# Patient Record
Sex: Male | Born: 1970 | Race: Black or African American | Hispanic: No | Smoking: Never smoker
Health system: Southern US, Community
[De-identification: ages and names within clinical notes are randomized; demographics above are authoritative.]

## PROBLEM LIST (undated history)

## (undated) DIAGNOSIS — I10 Essential (primary) hypertension: Secondary | ICD-10-CM

## (undated) HISTORY — PX: APPENDECTOMY: SHX54

---

## 1998-09-29 ENCOUNTER — Encounter: Payer: Self-pay | Admitting: Surgery

## 1998-09-30 ENCOUNTER — Inpatient Hospital Stay (HOSPITAL_COMMUNITY): Admission: EM | Admit: 1998-09-30 | Discharge: 1998-10-03 | Payer: Self-pay

## 2012-11-04 ENCOUNTER — Telehealth: Payer: Self-pay | Admitting: Family Medicine

## 2013-08-30 ENCOUNTER — Encounter: Payer: Self-pay | Admitting: *Deleted

## 2013-08-30 ENCOUNTER — Ambulatory Visit (INDEPENDENT_AMBULATORY_CARE_PROVIDER_SITE_OTHER): Payer: Managed Care, Other (non HMO) | Admitting: Family Medicine

## 2013-08-30 ENCOUNTER — Encounter: Payer: Self-pay | Admitting: Family Medicine

## 2013-08-30 VITALS — BP 164/83 | HR 91 | Temp 99.8°F | Ht 71.0 in | Wt 204.0 lb

## 2013-08-30 DIAGNOSIS — L0291 Cutaneous abscess, unspecified: Secondary | ICD-10-CM

## 2013-08-30 DIAGNOSIS — L989 Disorder of the skin and subcutaneous tissue, unspecified: Secondary | ICD-10-CM

## 2013-08-30 DIAGNOSIS — L0390 Cellulitis, unspecified: Principal | ICD-10-CM

## 2013-08-30 LAB — POCT CBC
Granulocyte percent: 72.8 %G (ref 37–80)
HCT, POC: 42.2 % — AB (ref 43.5–53.7)
Hemoglobin: 13.9 g/dL — AB (ref 14.1–18.1)
Lymph, poc: 2.3 (ref 0.6–3.4)
MCH, POC: 29.8 pg (ref 27–31.2)
MCHC: 32.9 g/dL (ref 31.8–35.4)
MCV: 90.6 fL (ref 80–97)
MPV: 7.5 fL (ref 0–99.8)
POC Granulocyte: 6.9 (ref 2–6.9)
POC LYMPH PERCENT: 24.3 %L (ref 10–50)
Platelet Count, POC: 264 10*3/uL (ref 142–424)
RBC: 4.7 M/uL (ref 4.69–6.13)
RDW, POC: 12.9 %
WBC: 9.5 10*3/uL (ref 4.6–10.2)

## 2013-08-30 MED ORDER — SULFAMETHOXAZOLE-TMP DS 800-160 MG PO TABS: 1.0000 | ORAL_TABLET | Freq: Two times a day (BID) | ORAL | Status: DC

## 2013-08-30 MED ORDER — CEFTRIAXONE SODIUM 1 G IJ SOLR
1.0000 g | Freq: Once | INTRAMUSCULAR | Status: AC
  Administered 2013-08-30: 1 g via INTRAMUSCULAR

## 2013-08-30 NOTE — Progress Notes (Signed)
   Subjective:    Patient ID: Thomas MoteChristopher Joffe, male    DOB: 05/29/1970, 43 y.o.   MRN: [id number]  HPI Patient here tonight for boil in bend of left shoulder area. This was first noticed about 5 days ago.         There are no active problems to display for this patient.  No outpatient encounter prescriptions on file as of 08/30/2013.    Review of Systems  Constitutional: Negative.   HENT: Negative.   Eyes: Negative.   Respiratory: Negative.   Cardiovascular: Negative.   Gastrointestinal: Negative.   Endocrine: Negative.   Genitourinary: Negative.   Musculoskeletal: Negative.   Skin: Negative.        Boil on left shoulder  Allergic/Immunologic: Negative.   Neurological: Negative.   Hematological: Negative.   Psychiatric/Behavioral: Negative.        Objective:   Physical Exam BP 164/83  Pulse 91  Temp(Src) 99.8 F (37.7 C) (Oral)  Ht 5\' 11"  (1.803 m)  Wt 204 lb (92.534 kg)  BMI 28.46 kg/m2  There is a furuncle near the left a.c. joint that appears to have multiple openings on it. With light pressure and a Q-tip drainage was expressed from the furuncle and sent for culture and sensitivity. A small wick was inserted in the opening to prevent it from closing. The patient was told he could change the dressing if it became soaked with blood and put another dressing on this evening.  Results for orders placed in visit on 08/30/13  POCT CBC      Result Value Ref Range   WBC 9.5  4.6 - 10.2 K/uL   Lymph, poc 2.3  0.6 - 3.4   POC LYMPH PERCENT 24.3  10 - 50 %L   MID (cbc)    0 - 0.9   POC MID %    0 - 12 %M   POC Granulocyte 6.9  2 - 6.9   Granulocyte percent 72.8  37 - 80 %G   RBC 4.7  4.69 - 6.13 M/uL   Hemoglobin 13.9 (*) 14.1 - 18.1 g/dL   HCT, POC 40.942.2 (*) 81.143.5 - 53.7 %   MCV 90.6  80 - 97 fL   MCH, POC 29.8  27 - 31.2 pg   MCHC 32.9  31.8 - 35.4 g/dL   RDW, POC 91.412.9     Platelet Count, POC 264.0  142 - 424 K/uL   MPV 7.5  0 - 99.8 fL   The patient was  made aware of the CBC results before he left the office    Assessment & Plan:   1. Skin lesion - Aerobic culture - cefTRIAXone (ROCEPHIN) injection 1 g; Inject 1 g into the muscle once. - POCT CBC  2. Abscess and cellulitis of skin of left shoulder near a.c. joint Patient Instructions  Change dressing as needed Take antibiotic as directed Take extra strength Tylenol 2 every 6 hours if needed for severe pain Return to clinic tomorrow for x-ray of shoulder  Remain out of work--- note was given  Nyra Capeson W. Sandria Mcenroe MD

## 2013-08-30 NOTE — Patient Instructions (Signed)
Change dressing as needed Take antibiotic as directed Take extra strength Tylenol 2 every 6 hours if needed for severe pain Return to clinic tomorrow for x-ray of shoulder

## 2013-08-31 ENCOUNTER — Encounter: Payer: Self-pay | Admitting: *Deleted

## 2013-08-31 ENCOUNTER — Ambulatory Visit (INDEPENDENT_AMBULATORY_CARE_PROVIDER_SITE_OTHER): Payer: Managed Care, Other (non HMO)

## 2013-08-31 ENCOUNTER — Other Ambulatory Visit (INDEPENDENT_AMBULATORY_CARE_PROVIDER_SITE_OTHER): Payer: Managed Care, Other (non HMO) | Admitting: *Deleted

## 2013-08-31 ENCOUNTER — Other Ambulatory Visit: Payer: Self-pay | Admitting: *Deleted

## 2013-08-31 ENCOUNTER — Other Ambulatory Visit: Payer: Managed Care, Other (non HMO)

## 2013-08-31 DIAGNOSIS — IMO0002 Reserved for concepts with insufficient information to code with codable children: Secondary | ICD-10-CM

## 2013-08-31 DIAGNOSIS — L02414 Cutaneous abscess of left upper limb: Secondary | ICD-10-CM

## 2013-08-31 MED ORDER — DEXTROSE 5 % IV SOLN: 1.0000 g | Freq: Once | INTRAVENOUS | Status: DC

## 2013-08-31 MED ORDER — CEFTRIAXONE SODIUM 1 G IJ SOLR
1.0000 g | Freq: Once | INTRAMUSCULAR | Status: AC
  Administered 2013-08-31: 1 g via INTRAMUSCULAR

## 2013-09-02 ENCOUNTER — Encounter: Payer: Self-pay | Admitting: *Deleted

## 2013-09-02 LAB — AEROBIC CULTURE

## 2013-09-02 NOTE — Progress Notes (Signed)
Patient ID: Thomas MoteChristopher Lauture, male   DOB: 04/17/1970, 43 y.o.   MRN: [id number] Patient came in today for DWM to recheck the left shoulder abscess that we have been following. The area is better, only slight redness and the area was slightly open. There is a scant amount of drainage on the bandage that pt is wearing. DWM expressed the abscess with only minimal return. The area was cleaned with peroxide and covered with a clean peroxide soaked gauze and bandaid. Pt is to see [facility] next Tues and then return to work next [employer].

## 2013-09-07 ENCOUNTER — Ambulatory Visit: Payer: Managed Care, Other (non HMO) | Admitting: Physician Assistant

## 2013-09-07 ENCOUNTER — Telehealth: Payer: Self-pay | Admitting: Family Medicine

## 2013-09-07 NOTE — Telephone Encounter (Signed)
Patient aware.

## 2013-09-07 NOTE — Telephone Encounter (Signed)
Message copied by Azalee CourseFULP, ASHLEY on Tue Sep 07, 2013  4:41 PM ------      Message from: Inis SizerWEBSTER, WILLIAM L      Created: Tue Sep 07, 2013  1:27 PM       Pls inform ATB he is taking covers the bacteria ------

## 2013-09-10 ENCOUNTER — Encounter: Payer: Self-pay | Admitting: Physician Assistant

## 2013-09-10 ENCOUNTER — Ambulatory Visit (INDEPENDENT_AMBULATORY_CARE_PROVIDER_SITE_OTHER): Payer: Managed Care, Other (non HMO) | Admitting: Physician Assistant

## 2013-09-10 VITALS — BP 132/66 | HR 88 | Temp 98.7°F | Ht 71.0 in | Wt 204.0 lb

## 2013-09-10 DIAGNOSIS — L02219 Cutaneous abscess of trunk, unspecified: Secondary | ICD-10-CM

## 2013-09-10 DIAGNOSIS — L03319 Cellulitis of trunk, unspecified: Principal | ICD-10-CM

## 2013-09-10 NOTE — Progress Notes (Signed)
Subjective:     Patient ID: Rica MoteChristopher Altemus, male   DOB: 12/02/1970, 43 y.o.   MRN: [id number]  HPI Pt here for recheck of cellulitis Still currently on ATB Denies increase in sx  Review of Systems No pain to the shoulder Still with some drainage Denies any increase in redness    Objective:   Physical Exam Still with draining lesion to the superior L shoulder Much improved from prev Decrease in erythema and induration No TTP FROM of the shoulder    Assessment:     Cellulitis    Plan:     Continue with daily dressing change Keep area clean and dry Finish ATB F/U prn

## 2013-09-10 NOTE — Patient Instructions (Signed)
Cellulitis Cellulitis is an infection of the skin and the tissue beneath it. The infected area is usually red and tender. Cellulitis occurs most often in the arms and lower legs.  CAUSES  Cellulitis is caused by bacteria that enter the skin through cracks or cuts in the skin. The most common types of bacteria that cause cellulitis are Staphylococcus and Streptococcus. SYMPTOMS   Redness and warmth.  Swelling.  Tenderness or pain.  Fever. DIAGNOSIS  Your caregiver can usually determine what is wrong based on a physical exam. Blood tests may also be done. TREATMENT  Treatment usually involves taking an antibiotic medicine. HOME CARE INSTRUCTIONS   Take your antibiotics as directed. Finish them even if you start to feel better.  Keep the infected arm or leg elevated to reduce swelling.  Apply a warm cloth to the affected area up to 4 times per day to relieve pain.  Only take over-the-counter or prescription medicines for pain, discomfort, or fever as directed by your caregiver.  Keep all follow-up appointments as directed by your caregiver. SEEK MEDICAL CARE IF:   You notice red streaks coming from the infected area.  Your red area gets larger or turns dark in color.  Your bone or joint underneath the infected area becomes painful after the skin has healed.  Your infection returns in the same area or another area.  You notice a swollen bump in the infected area.  You develop new symptoms. SEEK IMMEDIATE MEDICAL CARE IF:   You have a fever.  You feel very sleepy.  You develop vomiting or diarrhea.  You have a general ill feeling (malaise) with muscle aches and pains. MAKE SURE YOU:   Understand these instructions.  Will watch your condition.  Will get help right away if you are not doing well or get worse. Document Released: 12/12/2004 Document Revised: 09/03/2011 Document Reviewed: 05/20/2011 ExitCare Patient Information 2015 [employer]. This information is  not intended to replace advice given to you by your health care provider. Make sure you discuss any questions you have with your health care provider.  

## 2013-09-23 ENCOUNTER — Telehealth: Payer: Self-pay | Admitting: Family Medicine

## 2013-09-25 NOTE — Telephone Encounter (Signed)
Message copied by Tamera PuntWRAY, WENDY S on Sat Sep 25, 2013  8:11 AM ------      Message from: Inis SizerWEBSTER, WILLIAM L      Created: Tue Sep 07, 2013  1:27 PM       Pls inform ATB he is taking covers the bacteria ------

## 2015-11-10 IMAGING — CR DG SHOULDER 2+V*L*
3 series · 3 of 3 positions shown · non-contrast
Comparison: None.

CLINICAL DATA: Swelling along the top of the shoulder.

EXAM:
LEFT SHOULDER - 2+ VIEW

[view not recorded (1 of 3)]
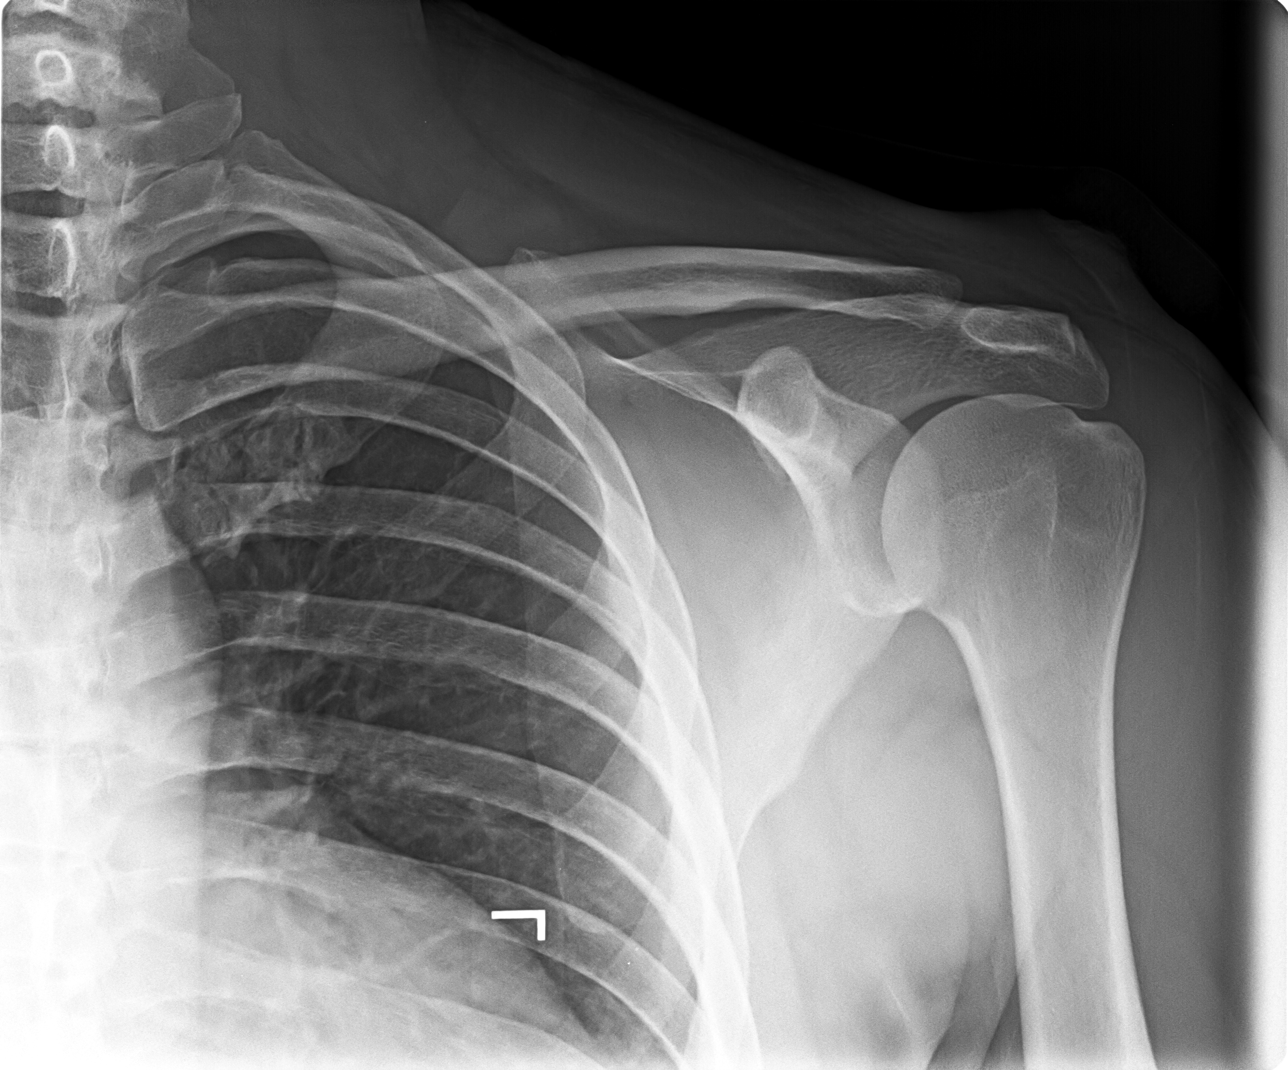

[view not recorded (2 of 3)]
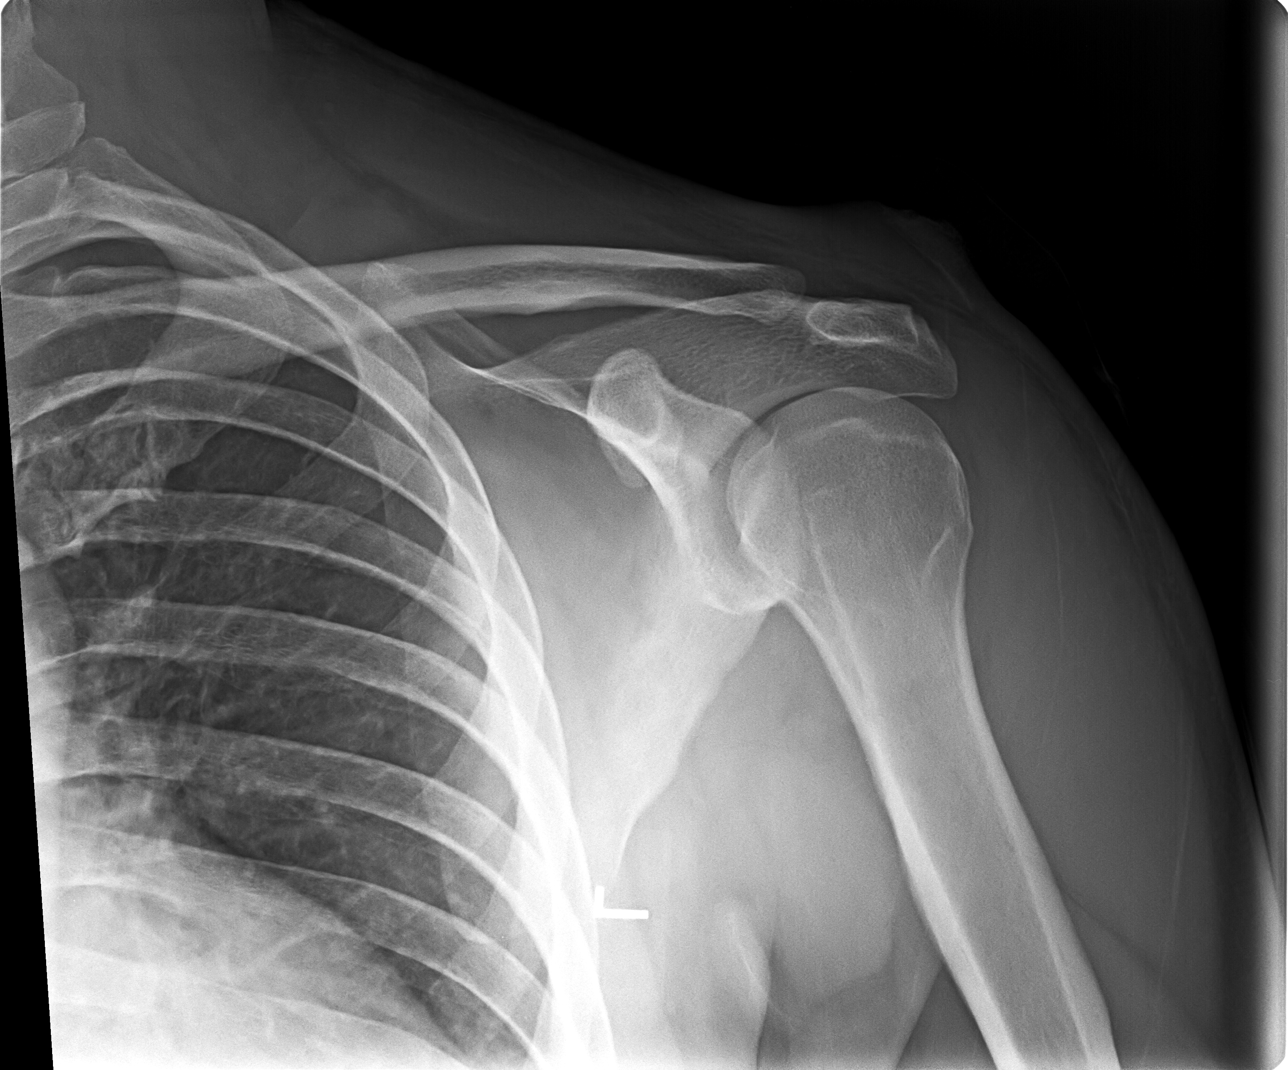

[view not recorded (3 of 3)]
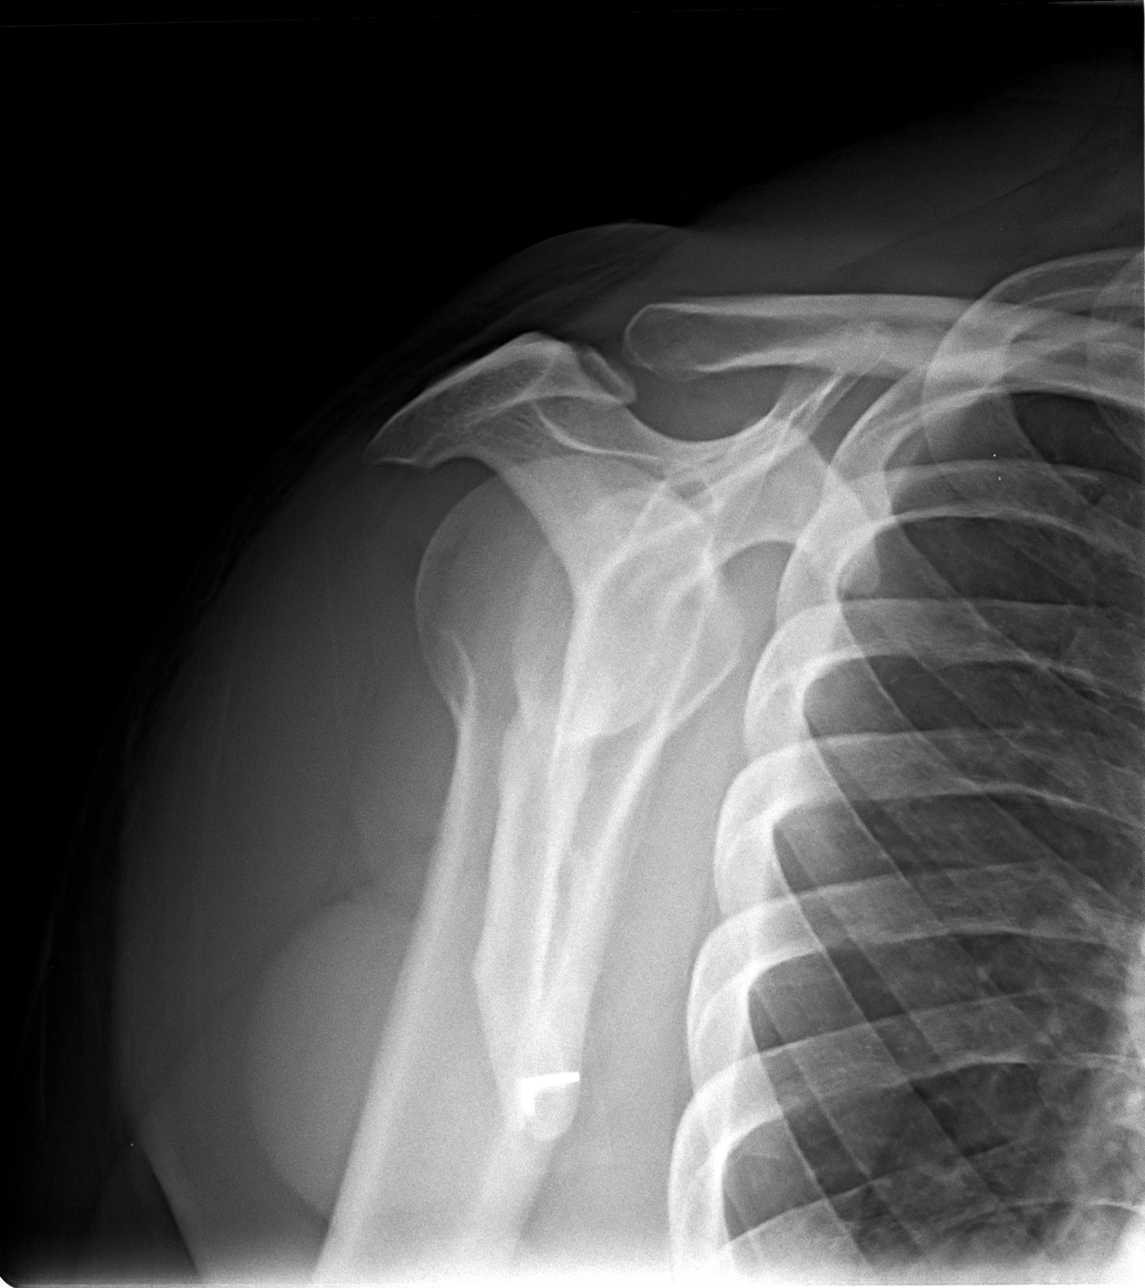

[3 of 3 positions shown; findings below may reference images not displayed]

FINDINGS: Focal soft tissue swelling is observed above the acromion and AC
joint. I do not observe a fracture or separation of the AC joint. No
dislocation. No abnormal regional calcifications.
IMPRESSION: 1. Soft tissue swelling above the acromion and AC joint. Bony
structures unremarkable.

## 2017-04-09 ENCOUNTER — Other Ambulatory Visit: Payer: Self-pay | Admitting: *Deleted

## 2017-04-09 NOTE — Telephone Encounter (Signed)
Closing encounter Fax was to go to Nils PyleWilliam [place], [place] who now works [facility]

## 2019-05-28 ENCOUNTER — Ambulatory Visit: Payer: 59 | Attending: Internal Medicine

## 2019-05-28 DIAGNOSIS — Z23 Encounter for immunization: Secondary | ICD-10-CM

## 2019-05-28 NOTE — Progress Notes (Signed)
   Covid-19 Vaccination Clinic  Name:  Thomas Dunlap    MRN: [id number] DOB: Mar 07, 1971  05/28/2019  Thomas Dunlap was observed post Covid-19 immunization for 15 minutes without incident. He was provided with Vaccine Information Sheet and instruction to access the V-Safe system.   Thomas Dunlap was instructed to call 911 with any severe reactions post vaccine: Marland Kitchen Difficulty breathing  . Swelling of face and throat  . A fast heartbeat  . A bad rash all over body  . Dizziness and weakness   Immunizations Administered    Name Date Dose VIS Date Route   Moderna COVID-19 Vaccine 05/28/2019  9:16 AM 0.5 mL 02/16/2019 Intramuscular   Manufacturer: Moderna   Lot: [id number]   NDC: 14782-956-21

## 2019-06-29 ENCOUNTER — Ambulatory Visit: Payer: 59 | Attending: Internal Medicine

## 2019-06-29 DIAGNOSIS — Z23 Encounter for immunization: Secondary | ICD-10-CM

## 2019-06-29 NOTE — Progress Notes (Signed)
   Covid-19 Vaccination Clinic  Name:  Thomas Dunlap    MRN: [id number] DOB: 03-02-1971  06/29/2019  [name] was observed post Covid-19 immunization for 15 minutes without incident. He was provided with Vaccine Information Sheet and instruction to access the V-Safe system.   [name] was instructed to call 911 with any severe reactions post vaccine: Marland Kitchen Difficulty breathing  . Swelling of face and throat  . A fast heartbeat  . A bad rash all over body  . Dizziness and weakness   Immunizations Administered    Name Date Dose VIS Date Route   Moderna COVID-19 Vaccine 06/29/2019 10:39 AM 0.5 mL 02/16/2019 Intramuscular   Manufacturer: Moderna   Lot: [id number]   NDC: 42903-795-58

## 2021-05-21 ENCOUNTER — Encounter: Payer: Self-pay | Admitting: Nurse Practitioner

## 2021-05-21 ENCOUNTER — Telehealth: Payer: Self-pay | Admitting: *Deleted

## 2021-05-21 ENCOUNTER — Ambulatory Visit (INDEPENDENT_AMBULATORY_CARE_PROVIDER_SITE_OTHER): Payer: 59 | Admitting: Nurse Practitioner

## 2021-05-21 VITALS — BP 167/91 | HR 84 | Temp 98.4°F | Ht 71.0 in | Wt 205.0 lb

## 2021-05-21 DIAGNOSIS — R21 Rash and other nonspecific skin eruption: Secondary | ICD-10-CM

## 2021-05-21 NOTE — Progress Notes (Signed)
? ?  Subjective:  ? ? Patient ID: Thomas Dunlap, male    DOB: Oct 07, 1970, 51 y.o.   MRN: [id number] ? ? ?Chief Complaint: Rash (Bilateral legs, back side of arm /Dermatology referral?) ? ? ?Rash ?Pertinent negatives include no eye pain or shortness of breath.  ?Patient comes in today c/o rash. This started about 1 week ago. Is not spreading. He went to urgent care last Friday. He was given a steroid shot , oral steroids and a cream. They ere not sure what the rash was. He says he is no better. The rash does not itch and has not spread any more. Rash is mainly on his legs ? ? ? ?Review of Systems  ?Constitutional:  Negative for diaphoresis.  ?Eyes:  Negative for pain.  ?Respiratory:  Negative for shortness of breath.   ?Cardiovascular:  Negative for chest pain, palpitations and leg swelling.  ?Gastrointestinal:  Negative for abdominal pain.  ?Endocrine: Negative for polydipsia.  ?Skin:  Positive for rash.  ?Neurological:  Negative for dizziness, weakness and headaches.  ?Hematological:  Does not bruise/bleed easily.  ?All other systems reviewed and are negative. ? ?   ?Objective:  ? Physical Exam ?Constitutional:   ?   Appearance: Normal appearance.  ?Skin: ?   General: Skin is warm.  ?   Comments: Erythematous macular lesions of varying sizes covering both lower ext.  ?Neurological:  ?   General: No focal deficit present.  ?   Mental Status: He is alert and oriented to person, place, and time.  ?Psychiatric:     ?   Mood and Affect: Mood normal.     ?   Behavior: Behavior normal.  ? ? ?BP (!) 167/91   Pulse 84   Temp 98.4 ?F (36.9 ?C)   Ht 5\' 11"  (1.803 m)   Wt 205 lb (93 kg)   SpO2 97%   BMI 28.59 kg/m?  ? ? ? ?   ?Assessment & Plan:  ?Thomas Dunlap in today with chief complaint of Rash (Bilateral legs, back side of arm /Dermatology referral?) ? ? ?1. Rash ?Continue steroids and cream ?Avoid scratching ?- Ambulatory referral to Dermatology ? ? ? ?The above assessment and management plan was discussed with  the patient. The patient verbalized understanding of and has agreed to the management plan. Patient is aware to call the clinic if symptoms persist or worsen. Patient is aware when to return to the clinic for a follow-up visit. Patient educated on when it is appropriate to go to the emergency department.  ? ?Mary-Margaret Rica Mote, FNP ? ? ? ?

## 2021-05-21 NOTE — Patient Instructions (Signed)
Rash, Adult  A rash is a change in the color of your skin. A rash can also change the way your skin feels. There are many different conditions and factors that can causea rash. Follow these instructions at home: The goal of treatment is to stop the itching and keep the rash from spreading. Watch for any changes in your symptoms. Let your doctor know about them. Followthese instructions to help with your condition: Medicine Take or apply over-the-counter and prescription medicines only as told by your doctor. These may include medicines: To treat red or swollen skin (corticosteroid creams). To treat itching. To treat an allergy (oral antihistamines). To treat very bad symptoms (oral corticosteroids).  Skin care Put cool cloths (compresses) on the affected areas. Do not scratch or rub your skin. Avoid covering the rash. Make sure that the rash is exposed to air as much as possible. Managing itching and discomfort Avoid hot showers or baths. These can make itching worse. A cold shower may help. Try taking a bath with: Epsom salts. You can get these at your local pharmacy or grocery store. Follow the instructions on the package. Baking soda. Pour a small amount into the bath as told by your doctor. Colloidal oatmeal. You can get this at your local pharmacy or grocery store. Follow the instructions on the package. Try putting baking soda paste onto your skin. Stir water into baking soda until it gets like a paste. Try putting on a lotion that relieves itchiness (calamine lotion). Keep cool and out of the sun. Sweating and being hot can make itching worse. General instructions  Rest as needed. Drink enough fluid to keep your pee (urine) pale yellow. Wear loose-fitting clothing. Avoid scented soaps, detergents, and perfumes. Use gentle soaps, detergents, perfumes, and other cosmetic products. Avoid anything that causes your rash. Keep a journal to help track what causes your rash. Write  down: What you eat. What cosmetic products you use. What you drink. What you wear. This includes jewelry. Keep all follow-up visits as told by your doctor. This is important.  Contact a doctor if: You sweat at night. You lose weight. You pee (urinate) more than normal. You pee less than normal, or you notice that your pee is a darker color than normal. You feel weak. You throw up (vomit). Your skin or the whites of your eyes look yellow (jaundice). Your skin: Tingles. Is numb. Your rash: Does not go away after a few days. Gets worse. You are: More thirsty than normal. More tired than normal. You have: New symptoms. Pain in your belly (abdomen). A fever. Watery poop (diarrhea). Get help right away if: You have a fever and your symptoms suddenly get worse. You start to feel mixed up (confused). You have a very bad headache or a stiff neck. You have very bad joint pains or stiffness. You have jerky movements that you cannot control (seizure). Your rash covers all or most of your body. The rash may or may not be painful. You have blisters that: Are on top of the rash. Grow larger. Grow together. Are painful. Are inside your nose or mouth. You have a rash that: Looks like purple pinprick-sized spots all over your body. Has a "bull's eye" or looks like a target. Is red and painful, causes your skin to peel, and is not from being in the sun too long. Summary A rash is a change in the color of your skin. A rash can also change the way your skin feels.   The goal of treatment is to stop the itching and keep the rash from spreading. Take or apply over-the-counter and prescription medicines only as told by your doctor. Contact a doctor if you have new symptoms or symptoms that get worse. Keep all follow-up visits as told by your doctor. This is important. This information is not intended to replace advice given to you by your health care provider. Make sure you discuss any  questions you have with your healthcare provider. Document Revised: 06/26/2018 Document Reviewed: 10/06/2017 Elsevier Patient Education  2022 Elsevier Inc.  

## 2022-06-17 ENCOUNTER — Ambulatory Visit: Payer: 59 | Admitting: Family

## 2023-05-28 ENCOUNTER — Other Ambulatory Visit: Payer: Self-pay

## 2023-05-28 ENCOUNTER — Emergency Department (HOSPITAL_COMMUNITY)
Admission: EM | Admit: 2023-05-28 | Discharge: 2023-05-28 | Disposition: A | Discharge status: Home / Self Care | Payer: Self-pay | Attending: Emergency Medicine | Admitting: Emergency Medicine

## 2023-05-28 ENCOUNTER — Encounter (HOSPITAL_COMMUNITY): Payer: Self-pay | Admitting: *Deleted

## 2023-05-28 DIAGNOSIS — I10 Essential (primary) hypertension: Secondary | ICD-10-CM | POA: Insufficient documentation

## 2023-05-28 DIAGNOSIS — R42 Dizziness and giddiness: Secondary | ICD-10-CM

## 2023-05-28 DIAGNOSIS — Z79899 Other long term (current) drug therapy: Secondary | ICD-10-CM | POA: Insufficient documentation

## 2023-05-28 HISTORY — DX: Essential (primary) hypertension: I10

## 2023-05-28 LAB — CBC WITH DIFFERENTIAL/PLATELET
Abs Immature Granulocytes: 0.06 10*3/uL (ref 0.00–0.07)
Basophils Absolute: 0 10*3/uL (ref 0.0–0.1)
Basophils Relative: 0 %
Eosinophils Absolute: 0 10*3/uL (ref 0.0–0.5)
Eosinophils Relative: 0 %
HCT: 44 % (ref 39.0–52.0)
Hemoglobin: 15.1 g/dL (ref 13.0–17.0)
Immature Granulocytes: 0 %
Lymphocytes Relative: 7 %
Lymphs Abs: 1.1 10*3/uL (ref 0.7–4.0)
MCH: 30.1 pg (ref 26.0–34.0)
MCHC: 34.3 g/dL (ref 30.0–36.0)
MCV: 87.6 fL (ref 80.0–100.0)
Monocytes Absolute: 0.7 10*3/uL (ref 0.1–1.0)
Monocytes Relative: 5 %
Neutro Abs: 13.2 10*3/uL — ABNORMAL HIGH (ref 1.7–7.7)
Neutrophils Relative %: 88 %
Platelets: 336 10*3/uL (ref 150–400)
RBC: 5.02 MIL/uL (ref 4.22–5.81)
RDW: 13 % (ref 11.5–15.5)
WBC: 15.1 10*3/uL — ABNORMAL HIGH (ref 4.0–10.5)
nRBC: 0 % (ref 0.0–0.2)

## 2023-05-28 LAB — BASIC METABOLIC PANEL
Anion gap: 13 (ref 5–15)
BUN: 13 mg/dL (ref 6–20)
CO2: 27 mmol/L (ref 22–32)
Calcium: 9.8 mg/dL (ref 8.9–10.3)
Chloride: 94 mmol/L — ABNORMAL LOW (ref 98–111)
Creatinine, Ser: 1.01 mg/dL (ref 0.61–1.24)
GFR, Estimated: >60 mL/min (ref >60)
Glucose, Bld: 134 mg/dL — ABNORMAL HIGH (ref 70–99)
Potassium: 3.7 mmol/L (ref 3.5–5.1)
Sodium: 134 mmol/L — ABNORMAL LOW (ref 135–145)

## 2023-05-28 MED ORDER — MECLIZINE HCL 12.5 MG PO TABS
25.0000 mg | ORAL_TABLET | Freq: Once | ORAL | Status: AC
  Administered 2023-05-28: 25 mg via ORAL
  Filled 2023-05-28: qty 2

## 2023-05-28 MED ORDER — ONDANSETRON HCL 4 MG/2ML IJ SOLN
4.0000 mg | Freq: Once | INTRAMUSCULAR | Status: AC
  Administered 2023-05-28: 4 mg via INTRAVENOUS
  Filled 2023-05-28: qty 2

## 2023-05-28 MED ORDER — SODIUM CHLORIDE 0.9 % IV BOLUS
500.0000 mL | Freq: Once | INTRAVENOUS | Status: AC
  Administered 2023-05-28: 500 mL via INTRAVENOUS

## 2023-05-28 MED ORDER — ONDANSETRON 4 MG PO TBDP: 4.0000 mg | ORAL_TABLET | Freq: Three times a day (TID) | ORAL | 0 refills | Status: AC | PRN

## 2023-05-28 MED ORDER — MECLIZINE HCL 25 MG PO TABS: 25.0000 mg | ORAL_TABLET | Freq: Three times a day (TID) | ORAL | 0 refills | Status: AC | PRN

## 2023-05-28 MED ORDER — LOSARTAN POTASSIUM 25 MG PO TABS: 25.0000 mg | ORAL_TABLET | Freq: Every day | ORAL | 0 refills | Status: AC

## 2023-05-28 NOTE — ED Provider Notes (Signed)
 [place] EMERGENCY DEPARTMENT AT [facility] Provider Note   CSN: [id number] Arrival date & time: 05/28/23  1514     History  Chief Complaint  Patient presents with   Dizziness    Thomas Dunlap is a 53 y.o. male with a history of hypertension who presents the ED today for dizziness.  Patient reports that he has been feeling dizzy and nauseous for the past 2 days after being started on Hydrochlorothiazide by urgent care provider for his hypertension.  He denies any chest pain or shortness of breath.  States that dizziness is worse when he switches positions and makes him feel extremely nauseous.  He had 1 episode of vomiting prior to arrival today.  Denies any fevers, vision changes, weakness, or headaches.  No focal neurologic deficits.  He has not been take anything for symptoms at home.  Denies any aggravating events prior to onset of symptoms.  No additional complaints or concerns at this time.    Home Medications Prior to Admission medications   Medication Sig Start Date End Date Taking? Authorizing Provider  losartan (COZAAR) 25 MG tablet Take 1 tablet (25 mg total) by mouth daily. 05/28/23 06/27/23 Yes Maxwell Marion, PA-C  meclizine (ANTIVERT) 25 MG tablet Take 1 tablet (25 mg total) by mouth 3 (three) times daily as needed for up to 14 days for dizziness. 05/28/23 06/11/23 Yes Maxwell Marion, PA-C  ondansetron (ZOFRAN-ODT) 4 MG disintegrating tablet Take 1 tablet (4 mg total) by mouth every 8 (eight) hours as needed for up to 7 days for nausea or vomiting. 05/28/23 06/04/23 Yes Maxwell Marion, PA-C      Allergies    Patient has no known allergies.    Review of Systems   Review of Systems  Neurological:  Positive for dizziness.  All other systems reviewed and are negative.   Physical Exam Updated Vital Signs BP (!) 149/93   Pulse 81   Temp 98 F (36.7 C) (Oral)   Resp 17   Ht 5\' 9"  (1.753 m)   Wt 90.7 kg   SpO2 98%   BMI 29.53 kg/m  Physical Exam Vitals  and nursing note reviewed.  Constitutional:      General: He is not in acute distress.    Appearance: Normal appearance.  HENT:     Head: Normocephalic and atraumatic.     Mouth/Throat:     Mouth: Mucous membranes are moist.  Eyes:     Extraocular Movements: Extraocular movements intact.     Conjunctiva/sclera: Conjunctivae normal.     Pupils: Pupils are equal, round, and reactive to light.  Cardiovascular:     Rate and Rhythm: Normal rate and regular rhythm.     Pulses: Normal pulses.     Heart sounds: Normal heart sounds.  Pulmonary:     Effort: Pulmonary effort is normal.     Breath sounds: Normal breath sounds.  Abdominal:     Palpations: Abdomen is soft.     Tenderness: There is no abdominal tenderness.  Musculoskeletal:        General: Normal range of motion.     Cervical back: Normal range of motion.  Skin:    General: Skin is warm and dry.     Findings: No rash.  Neurological:     General: No focal deficit present.     Mental Status: He is alert.     Sensory: No sensory deficit.     Motor: No weakness.     Gait: Gait  normal.  Psychiatric:        Mood and Affect: Mood normal.        Behavior: Behavior normal.    ED Results / Procedures / Treatments   Labs (all labs ordered are listed, but only abnormal results are displayed) Labs Reviewed  BASIC METABOLIC PANEL - Abnormal; Notable for the following components:      Result Value   Sodium 134 (*)    Chloride 94 (*)    Glucose, Bld 134 (*)    All other components within normal limits  CBC WITH DIFFERENTIAL/PLATELET - Abnormal; Notable for the following components:   WBC 15.1 (*)    Neutro Abs 13.2 (*)    All other components within normal limits    EKG EKG Interpretation Date/Time:  Wednesday May 28 2023 16:39:00 EDT Ventricular Rate:  90 PR Interval:  191 QRS Duration:  87 QT Interval:  386 QTC Calculation: 473 R Axis:   76  Text Interpretation: Sinus rhythm Probable left atrial enlargement  Consider anterior infarct Borderline ST depression, inferior leads Borderline ST elevation, lateral leads No old tracing to compare Confirmed by Eber Hong ([id number]) on 05/28/2023 5:07:45 PM  Radiology No results found.  Procedures Procedures: not indicated.   Medications Ordered in ED Medications  meclizine (ANTIVERT) tablet 25 mg (25 mg Oral Given 05/28/23 1647)  ondansetron (ZOFRAN) injection 4 mg (4 mg Intravenous Given 05/28/23 1831)  sodium chloride 0.9 % bolus 500 mL (0 mLs Intravenous Stopped 05/28/23 2043)    ED Course/ Medical Decision Making/ A&P                                 Medical Decision Making Amount and/or Complexity of Data Reviewed Labs: ordered.  Risk Prescription drug management.   This patient presents to the ED for concern of dizziness, this involves an extensive number of treatment options, and is a complaint that carries with it a high risk of complications and morbidity.   Differential diagnosis includes: medication side effect, dehydration, electrolyte abnormality, anemia, headache, atypical migraine, etc.   Comorbidities  Seen HPI above   Additional History  Additional history obtained from recent urgent care records   Cardiac Monitoring / EKG  The patient was maintained on a cardiac monitor.  I personally viewed and interpreted the cardiac monitored which showed: sinus rhythm with a heart rate of 90 bpm.   Lab Tests  I ordered and personally interpreted labs.  The pertinent results include:   BMP shows elevated glucose of 134, otherwise reassuring Elevated WBC of 15.1, otherwise CBC is reassuring Patient had an episode of emesis prior to arrival, which could account for elevation in white count   Problem List / ED Course / Critical Interventions / Medication Management  Dizziness with nausea for the past 2 days after restarting hydrochlorothiazide.  Patient has tried taking amlodipine in the past but had stopped it due to lower  extremity edema.  Patient is not currently established with a primary care doctor but is post to see one in April.  He has been going to urgent care to get put on these medications. I ordered medications including: Meclizine, NS, and zofran for dizziness and nausea  Reevaluation of the patient after these medicines showed that the patient improved I have reviewed the patients home medicines and have made adjustments as needed   Social Determinants of Health  Access to healthcare   Test /  Admission - Considered  Discussed findings with patient and family at bedside.  All questions were answered. He is stable and safe discharge home. Return precautions given.       Final Clinical Impression(s) / ED Diagnoses Final diagnoses:  Vertigo  Hypertension, unspecified type    Rx / DC Orders ED Discharge Orders          Ordered    meclizine (ANTIVERT) 25 MG tablet  3 times daily PRN        05/28/23 2014    ondansetron (ZOFRAN-ODT) 4 MG disintegrating tablet  Every 8 hours PRN        05/28/23 2014    losartan (COZAAR) 25 MG tablet  Daily        05/28/23 2020              Maxwell Marion, PA-C 05/28/23 2242    Eber Hong, MD 05/29/23 1440

## 2023-05-28 NOTE — Discharge Instructions (Addendum)
 As discussed, your labs and EKG are reassuring. Take Meclizine up to 3 times a day as needed for dizziness as well as Zofran as needed for nausea.  Try the Epley maneuver at home to help with the dizziness.  Please keep your appointment with your primary care doctor to establish care and for reevaluation of your blood pressure. Stop Hydrochlorothiazide and start Losartan. Monitor your blood pressures at home.  Get help right away if you: Develop a severe headache or confusion. Have unusual weakness or numbness. Feel faint. Have severe pain in your chest or abdomen. Vomit repeatedly. Have trouble breathing.

## 2023-05-28 NOTE — ED Triage Notes (Addendum)
 Pt brought in by [facility] from home with c/o dizziness and nausea since Saturday. BP for EMS 185/105 and 178/99. Pt was placed on BP medication at Urgent Care a few months ago, but hasn't had any in the past 1-2 months. Pt was started on hydrochlorothiazide on Saturday and since starting the new mediation he has felt dizzy and nauseated.
